# Patient Record
Sex: Female | Born: 1995 | Race: White | Hispanic: No | Marital: Single | Smoking: Never smoker
Health system: Southern US, Community
[De-identification: ages and names within clinical notes are randomized; demographics above are authoritative.]

## PROBLEM LIST (undated history)

## (undated) DIAGNOSIS — T7840XA Allergy, unspecified, initial encounter: Secondary | ICD-10-CM

## (undated) DIAGNOSIS — J189 Pneumonia, unspecified organism: Secondary | ICD-10-CM

## (undated) DIAGNOSIS — F88 Other disorders of psychological development: Secondary | ICD-10-CM

## (undated) DIAGNOSIS — Q181 Preauricular sinus and cyst: Secondary | ICD-10-CM

## (undated) DIAGNOSIS — H65112 Acute and subacute allergic otitis media (mucoid) (sanguinous) (serous), left ear: Secondary | ICD-10-CM

## (undated) DIAGNOSIS — K219 Gastro-esophageal reflux disease without esophagitis: Secondary | ICD-10-CM

## (undated) HISTORY — PX: WISDOM TOOTH EXTRACTION: SHX21

## (undated) HISTORY — DX: Other disorders of psychological development: F88

## (undated) HISTORY — DX: Allergy, unspecified, initial encounter: T78.40XA

## (undated) HISTORY — DX: Gastro-esophageal reflux disease without esophagitis: K21.9

## (undated) HISTORY — DX: Pneumonia, unspecified organism: J18.9

## (undated) HISTORY — DX: Acute and subacute allergic otitis media (mucoid) (sanguinous) (serous), left ear: H65.112

## (undated) HISTORY — DX: Preauricular sinus and cyst: Q18.1

---
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
## 2018-02-19 DIAGNOSIS — M79671 Pain in right foot: Secondary | ICD-10-CM | POA: Diagnosis not present

## 2018-06-13 DIAGNOSIS — T7840XA Allergy, unspecified, initial encounter: Secondary | ICD-10-CM | POA: Diagnosis not present

## 2018-06-23 DIAGNOSIS — Z91018 Allergy to other foods: Secondary | ICD-10-CM | POA: Diagnosis not present

## 2018-06-23 DIAGNOSIS — J3089 Other allergic rhinitis: Secondary | ICD-10-CM | POA: Diagnosis not present

## 2018-06-23 DIAGNOSIS — J3081 Allergic rhinitis due to animal (cat) (dog) hair and dander: Secondary | ICD-10-CM | POA: Diagnosis not present

## 2018-06-23 DIAGNOSIS — J301 Allergic rhinitis due to pollen: Secondary | ICD-10-CM | POA: Diagnosis not present

## 2018-11-27 DIAGNOSIS — Z23 Encounter for immunization: Secondary | ICD-10-CM | POA: Diagnosis not present

## 2018-12-11 ENCOUNTER — Other Ambulatory Visit: Payer: Self-pay

## 2018-12-11 DIAGNOSIS — Z20822 Contact with and (suspected) exposure to covid-19: Secondary | ICD-10-CM

## 2018-12-12 LAB — NOVEL CORONAVIRUS, NAA: SARS-CoV-2, NAA: NOT DETECTED

## 2019-04-09 DIAGNOSIS — M79641 Pain in right hand: Secondary | ICD-10-CM | POA: Diagnosis not present

## 2019-04-09 DIAGNOSIS — Z6820 Body mass index (BMI) 20.0-20.9, adult: Secondary | ICD-10-CM | POA: Diagnosis not present

## 2019-04-09 DIAGNOSIS — M79642 Pain in left hand: Secondary | ICD-10-CM | POA: Diagnosis not present

## 2019-06-02 DIAGNOSIS — Q676 Pectus excavatum: Secondary | ICD-10-CM | POA: Diagnosis not present

## 2019-06-02 DIAGNOSIS — Z6820 Body mass index (BMI) 20.0-20.9, adult: Secondary | ICD-10-CM | POA: Diagnosis not present

## 2019-06-02 DIAGNOSIS — R0781 Pleurodynia: Secondary | ICD-10-CM | POA: Diagnosis not present

## 2019-07-15 IMAGING — CT CT ABD-PEL WO/W CM
2 of 9 series · 13 of 36 positions shown, 18 images · IV contrast (iopamidol)
Comparison: None.

CLINICAL DATA: Elevated testosterone level. Evaluate for possible
adrenal or ovarian lesion.

EXAM:
CT ABDOMEN AND PELVIS WITHOUT AND WITH CONTRAST
TECHNIQUE: Multidetector CT imaging of the abdomen and pelvis was performed
following the standard protocol before and following the bolus
administration of intravenous contrast.
CONTRAST:  100mL V8TRXH-Q99 IOPAMIDOL (V8TRXH-Q99) INJECTION 61%

[Series 6: portal venous · axial · portal-venous · 0.67mm/px · z∈[-377,-77]mm · 7 of 160 slices shown, 12 images]
[im 20/160  soft-tissue]
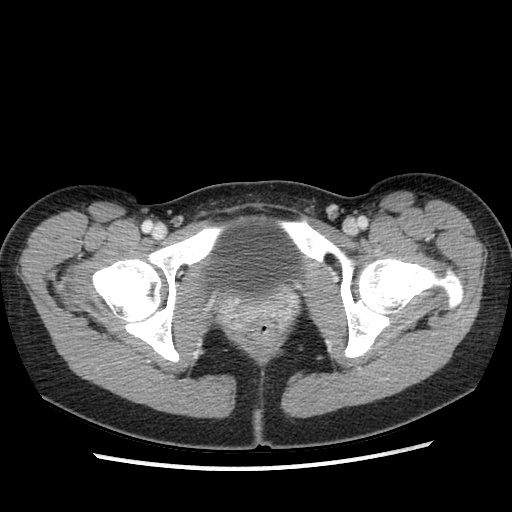
[im 20/160  bone]
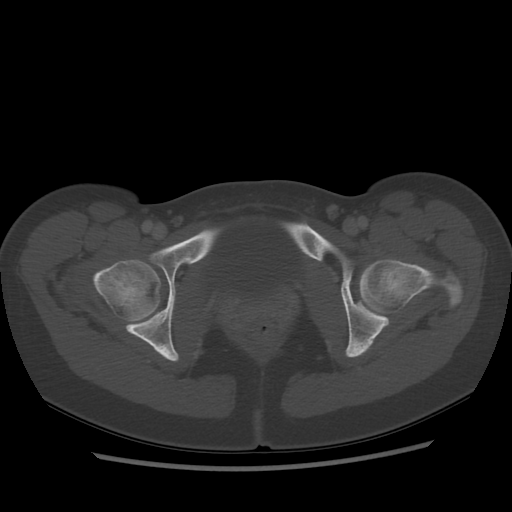
[im 40/160  soft-tissue]
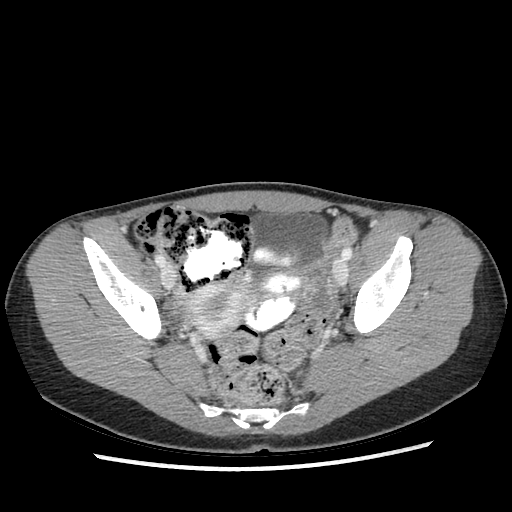
[im 60/160  soft-tissue]
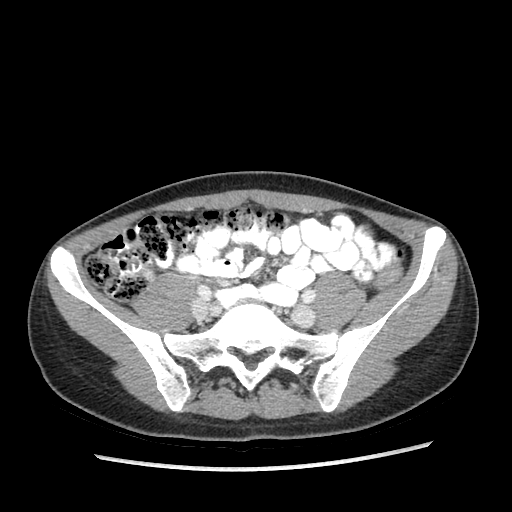
[im 80/160  soft-tissue]
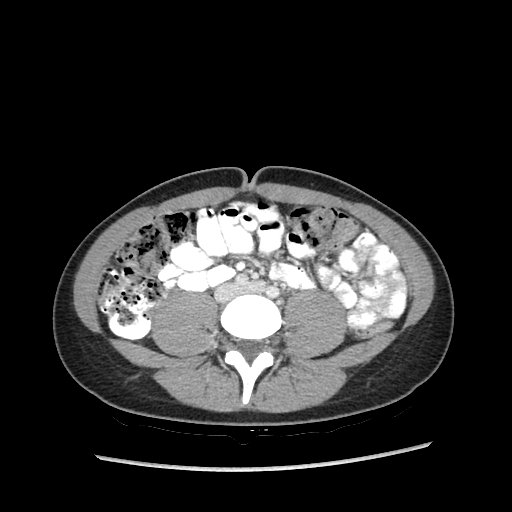
[im 80/160  lung]
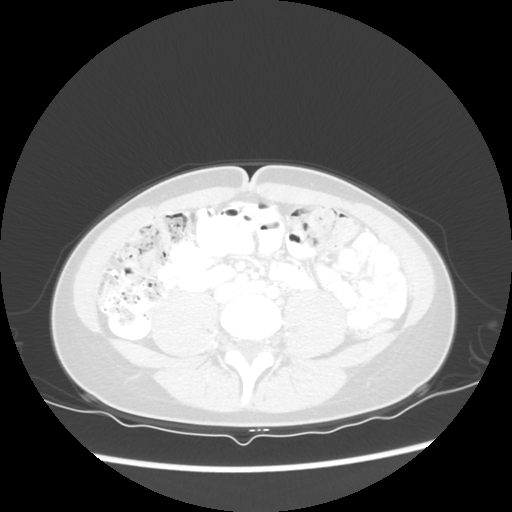
[im 100/160  soft-tissue]
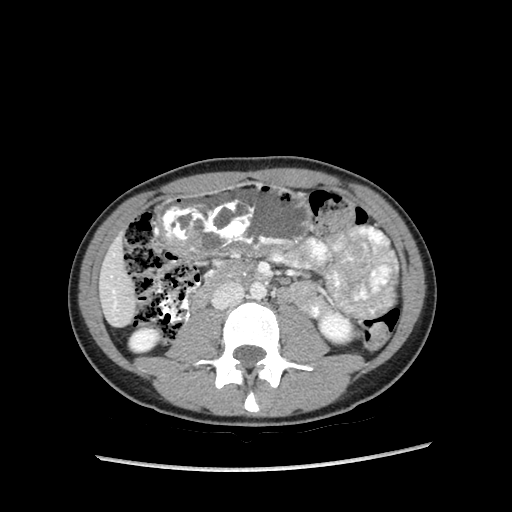
[im 100/160  lung]
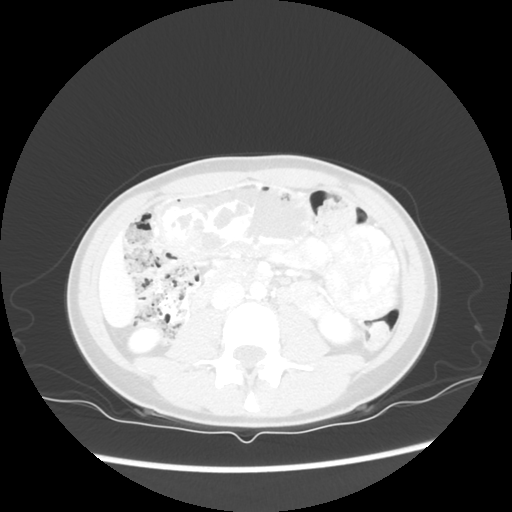
[im 120/160  soft-tissue]
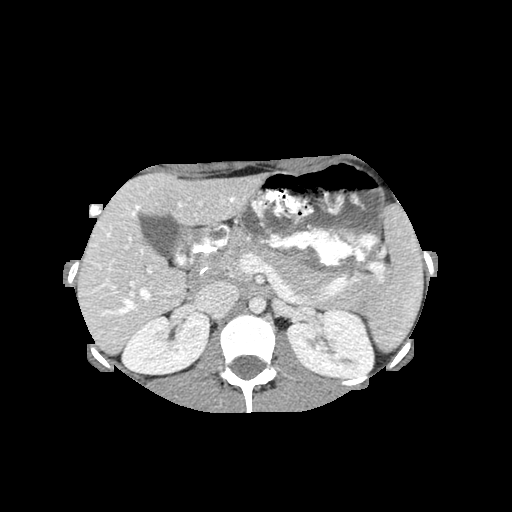
[im 120/160  lung]
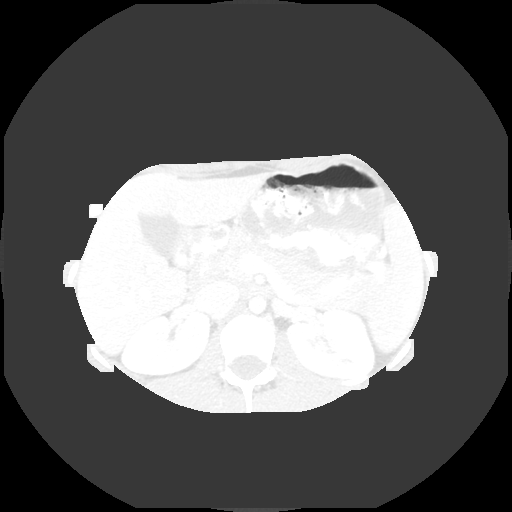
[im 140/160  soft-tissue]
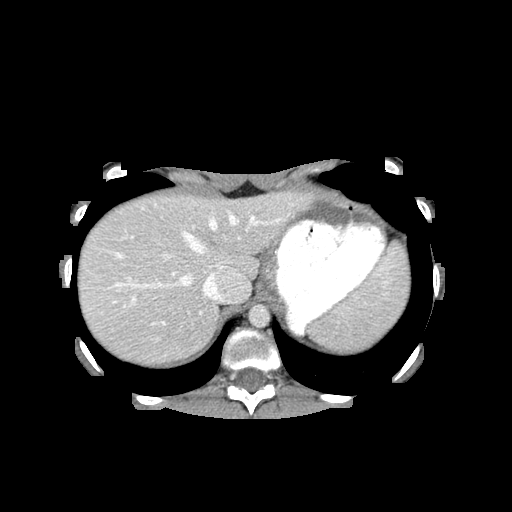
[im 140/160  lung]
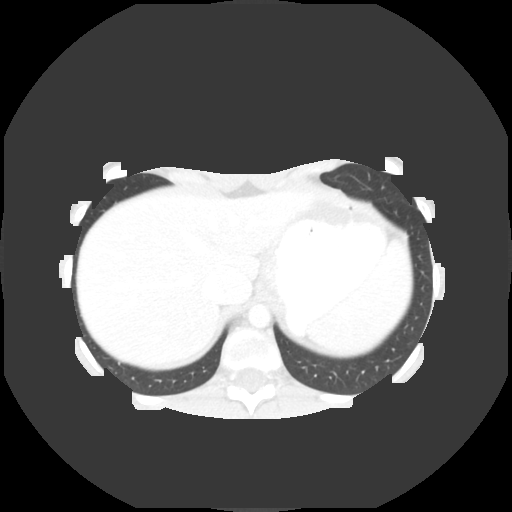

[Series 605: sagittal body · sagittal · 0.80mm/px · 6 of 138 slices shown]
[im 20/138  soft-tissue]
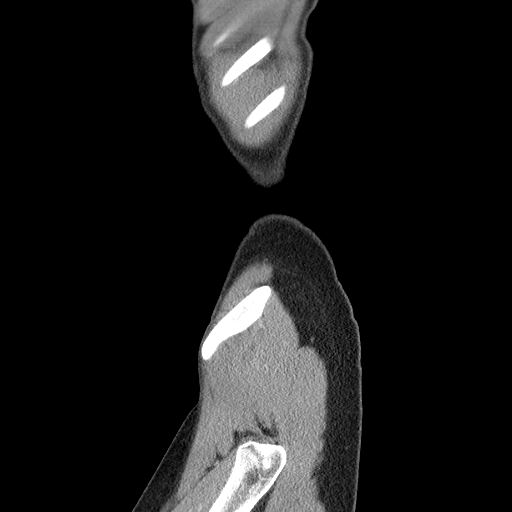
[im 40/138  soft-tissue]
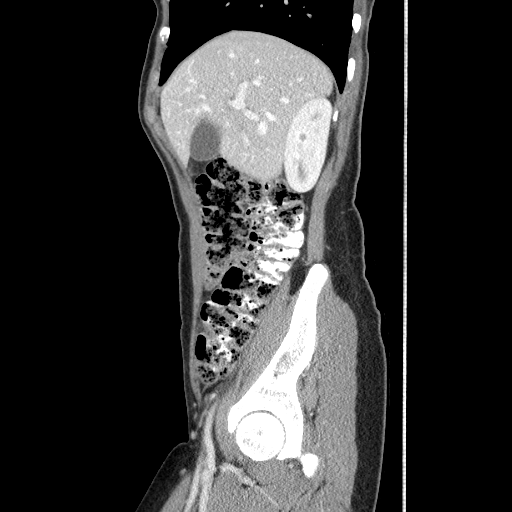
[im 59/138  soft-tissue]
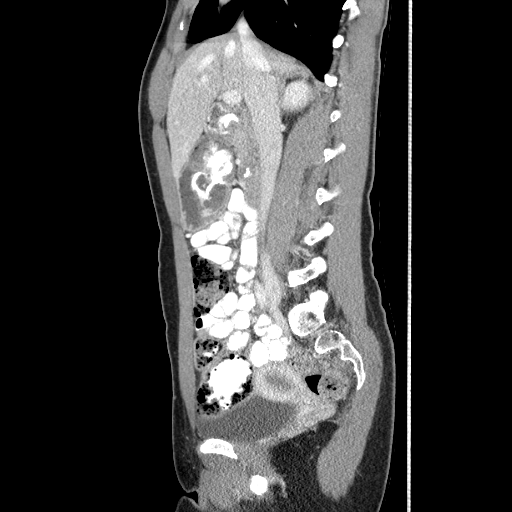
[im 79/138  soft-tissue]
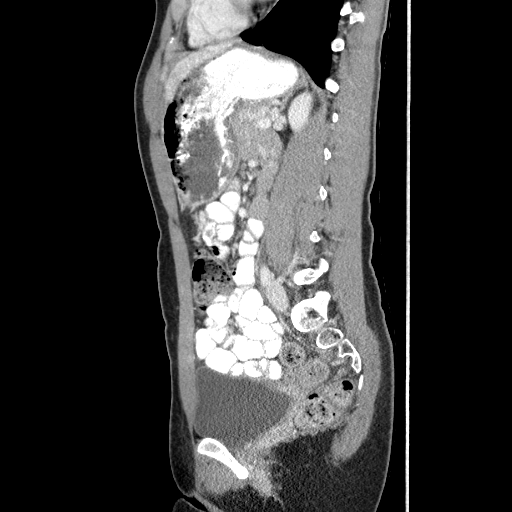
[im 98/138  soft-tissue]
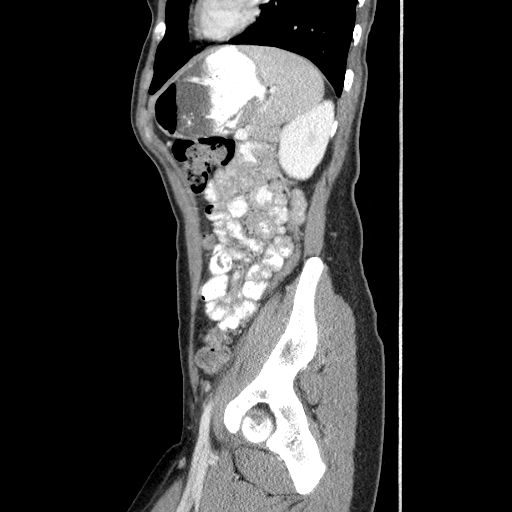
[im 118/138  soft-tissue]
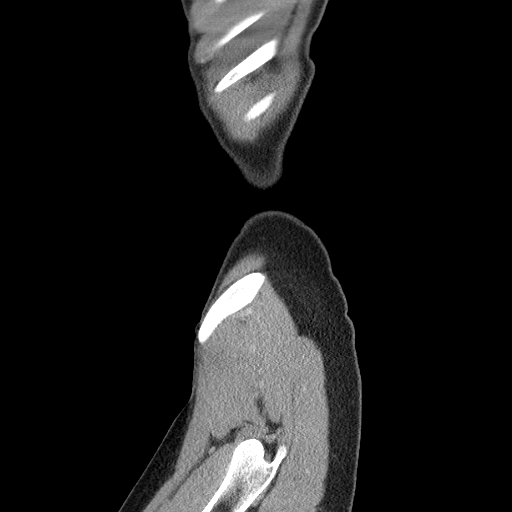

[13 of 36 positions shown; findings below may reference images not displayed]

FINDINGS: Lower chest: The lung bases are clear.  Pectus deformity noted.

Hepatobiliary: No hepatic lesions or intrahepatic biliary
dilatation. The portal and hepatic veins are normal. The gallbladder
is normal. No common bile duct dilatation.

Pancreas: No mass, inflammation or ductal dilatation.

Spleen: Normal size.  No focal lesions.

Adrenals/Urinary Tract: The adrenal glands are normal. No adrenal
gland nodules or masses identified. Normal enhancement. Both kidneys
are normal. The bladder is normal.

Stomach/Bowel: The stomach, duodenum, small bowel and colon are
grossly normal. No acute inflammatory process, mass lesions or
obstructive findings. The terminal ileum is normal. The appendix is
normal.

Vascular/Lymphatic: The aorta is normal in caliber. No dissection.
The branch vessels are patent. The major venous structures are
patent. No mesenteric or retroperitoneal mass or adenopathy. Small
scattered lymph nodes are noted.

Reproductive: The uterus and ovaries are unremarkable. There is a
simple appearing cyst associated with the left ovary. No worrisome
ovarian lesions.

Other: No pelvic mass or adenopathy. No free pelvic fluid
collections. No inguinal mass or adenopathy. No abdominal wall
hernia or subcutaneous lesions.

Musculoskeletal: No significant bony findings.
IMPRESSION: 1. No adrenal gland or ovarian lesions are identified. Simple
appearing left ovarian cyst.
2. No acute abdominal/pelvic findings, mass lesions or adenopathy.

## 2021-12-11 DIAGNOSIS — J309 Allergic rhinitis, unspecified: Secondary | ICD-10-CM | POA: Diagnosis not present

## 2021-12-11 DIAGNOSIS — J4520 Mild intermittent asthma, uncomplicated: Secondary | ICD-10-CM | POA: Diagnosis not present

## 2021-12-11 DIAGNOSIS — D6851 Activated protein C resistance: Secondary | ICD-10-CM | POA: Diagnosis not present

## 2021-12-11 DIAGNOSIS — Z23 Encounter for immunization: Secondary | ICD-10-CM | POA: Diagnosis not present

## 2021-12-11 DIAGNOSIS — Z01419 Encounter for gynecological examination (general) (routine) without abnormal findings: Secondary | ICD-10-CM | POA: Diagnosis not present

## 2022-02-02 ENCOUNTER — Ambulatory Visit (HOSPITAL_COMMUNITY): Admit: 2022-02-02 | Discharge status: Against Medical Advice | Payer: BLUE CROSS/BLUE SHIELD

## 2022-02-02 DIAGNOSIS — B349 Viral infection, unspecified: Secondary | ICD-10-CM | POA: Diagnosis not present

## 2022-02-02 DIAGNOSIS — J101 Influenza due to other identified influenza virus with other respiratory manifestations: Secondary | ICD-10-CM | POA: Diagnosis not present

## 2022-02-02 DIAGNOSIS — L03031 Cellulitis of right toe: Secondary | ICD-10-CM | POA: Diagnosis not present

## 2022-05-11 ENCOUNTER — Ambulatory Visit (HOSPITAL_COMMUNITY)
Admission: RE | Admit: 2022-05-11 | Discharge: 2022-05-11 | Disposition: A | Discharge status: Home / Self Care | Payer: BC Managed Care – PPO | Source: Ambulatory Visit

## 2022-05-11 ENCOUNTER — Encounter (HOSPITAL_COMMUNITY): Payer: Self-pay

## 2022-05-11 VITALS — BP 128/92 | HR 98 | Temp 97.8°F | Resp 16

## 2022-05-11 DIAGNOSIS — L03116 Cellulitis of left lower limb: Secondary | ICD-10-CM | POA: Diagnosis not present

## 2022-05-11 DIAGNOSIS — W57XXXA Bitten or stung by nonvenomous insect and other nonvenomous arthropods, initial encounter: Secondary | ICD-10-CM

## 2022-05-11 DIAGNOSIS — S90862A Insect bite (nonvenomous), left foot, initial encounter: Secondary | ICD-10-CM

## 2022-05-11 MED ORDER — DOXYCYCLINE HYCLATE 100 MG PO CAPS: 100.0000 mg | ORAL_CAPSULE | Freq: Two times a day (BID) | ORAL | 0 refills | Status: AC

## 2022-05-11 MED ORDER — TRIAMCINOLONE ACETONIDE 0.1 % EX CREA: 1.0000 | TOPICAL_CREAM | Freq: Two times a day (BID) | CUTANEOUS | 0 refills | Status: AC

## 2022-05-11 NOTE — Discharge Instructions (Signed)
Advised to use Epsom salt soaks, 1 tablespoon in warm water, soak for about 10 to 15 minutes, 3-4 times throughout the evening as this will help the area to resolve. Advised to use triamcinolone cream and apply to the area 2-3 times a day over the next several days as this will help reduce the inflammatory response. Advised take doxycycline 100 mg twice daily for 5 days only as this will help reduce infectious process.  Advised follow-up PCP return to urgent care as needed.

## 2022-05-11 NOTE — ED Provider Notes (Signed)
[facility]    CSN: [id number] Arrival date & time: 05/11/22  1116      History   Chief Complaint Chief Complaint  Patient presents with   Insect Bite    HPI Stephanie Kline is a 27 y.o. female.   27 year old female presents with a fire and bite on the left dorsal surface of the foot.  Patient indicates 6 [place] she was walking around the yard when she was bit by Archivistfire ants.  She indicates that she felt the bite on the left top of her foot.  She wore shoes the next day walking around and then afterwards she started having burning pain and discomfort at the site.  She indicates that a blister and redness developed.  She indicates that she has been treating the area over the past several days with a cortisone cream and has taken some Benadryl to decrease the itching and discomfort.  She indicates that the pain is resolved as well as the itching.  She is concerned about the blister that has formed it has not drained yet, she does have redness surrounding the blister area and mild redness around the third and fourth digit where the blister is located on the dorsal surface.  She is without fever or chills.     Past Medical History:  Diagnosis Date   Acute mucoid otitis media of left ear 03/08/2013   Allergy    Asthma    Cyst of ear canal    Infected in 2012 and had to be drained. Refer to ENT if recurs   GERD (gastroesophageal reflux disease) age 833 months - several yrs of age   Nl UGI at age 233 months, multiple meds over several year starting as infant: ranitidine, reglan, cisaprid, prilosec, proplusid, antacids .   Pneumonia 01/2004, 05/2004   pneumonia with asthma attack   Sensory integration disorder of childhood 2003   Received OT     Patient Active Problem List   Diagnosis Date Noted   Pulled muscle 10/21/2013   Need for prophylactic vaccination and inoculation against influenza 10/21/2013   BMI (body mass index),  pediatric, 5% to less than 85% for age 11/12/2013   Exercise induced bronchospasm 03/08/2013   Asthma, mild intermittent, well-controlled 06/27/2010   Seasonal allergies 06/27/2010    Past Surgical History:  Procedure Laterality Date   WISDOM TOOTH EXTRACTION      OB History   No obstetric history on file.      Home Medications    Prior to Admission medications   Medication Sig Start Date End Date Taking? Authorizing Provider  doxycycline (VIBRAMYCIN) 100 MG capsule Take 1 capsule (100 mg total) by mouth 2 (two) times daily. 05/11/22  Yes Ellsworth LennoxJames, Brieonna Crutcher, PA-C  Fexofenadine HCl (ALLEGRA PO) Take by mouth.   Yes [provider]  triamcinolone cream (KENALOG) 0.1 % Apply 1 Application topically 2 (two) times daily. 05/11/22  Yes Ellsworth LennoxJames, Roald Lukacs, PA-C  albuterol (VENTOLIN HFA) 108 (90 BASE) MCG/ACT inhaler Inhale 2 puffs into the lungs every 4 (four) hours as needed for wheezing or shortness of breath. 07/07/14   Preston FleetingHooker, Sandar Krinke B, MD  amoxicillin-clavulanate (AUGMENTIN) 875-125 MG tablet Take 1 tablet by mouth 2 (two) times daily. 01/30/15   Carmelina DaneAnderson, Jeffery S, MD  cetirizine (ZYRTEC) 10 MG chewable tablet Chew 10 mg by mouth daily.    [provider]  chlorpheniramine-HYDROcodone Stevphen Meuse(TUSSIONEX PENNKINETIC ER) 10-8 MG/5ML SUER Take  5 mLs by mouth 2 (two) times daily. 01/30/15   Carmelina DaneAnderson, Jeffery S, MD  fluticasone [facility](FLONASE) 50 MCG/ACT nasal spray Place 1 spray into both nostrils daily. 05/25/15 05/24/16  Estelle JuneKlett, Lynn M, NP  fluticasone (FLOVENT HFA) 44 MCG/ACT inhaler 2 puffs twice daily x1 week, then 1 puff twice daily x1 week. 03/08/13   Meryl DareWhitaker, Erin W, NP    Family History Family History  Problem Relation Age of Onset   Asthma Mother    Cancer Mother        breast   Stroke Mother    Asthma Father    Hyperlipidemia Maternal Grandmother    Hypertension Maternal Grandmother    Stroke Maternal Grandmother        brain stem after fall   Varicose Veins Maternal Grandmother     Alcohol abuse Maternal Grandfather    Cancer Maternal Grandfather        throat   COPD Maternal Grandfather    Diabetes Maternal Grandfather    Hearing loss Maternal Grandfather    Hyperlipidemia Maternal Grandfather    Vision loss Paternal Grandmother    Cancer Paternal Grandfather        colon   Hearing loss Paternal Grandfather    Heart disease Paternal Grandfather    Arthritis Neg Hx    Birth defects Neg Hx    Depression Neg Hx    Drug abuse Neg Hx    Early death Neg Hx    Kidney disease Neg Hx    Learning disabilities Neg Hx    Mental illness Neg Hx    Mental retardation Neg Hx    Miscarriages / Stillbirths Neg Hx     Social History Social History   Tobacco Use   Smoking status: Never   Smokeless tobacco: Never  Vaping Use   Vaping Use: Never used  Substance Use Topics   Alcohol use: Yes    Comment: socially   Drug use: No     Allergies   Ibuprofen, Red dye, and Sulfa antibiotics   Review of Systems Review of Systems  Skin:  Positive for rash (left foot mid distal dorsum insect bite.).     Physical Exam Triage Vital Signs ED Triage Vitals  Enc Vitals Group     BP 05/11/22 1125 (!) 128/92     Pulse Rate 05/11/22 1125 98     Resp 05/11/22 1125 16     Temp 05/11/22 1125 97.8 F (36.6 C)     Temp Source 05/11/22 1125 Oral     SpO2 05/11/22 1125 99 %     Weight --      Height --      Head Circumference --      Peak Flow --      Pain Score 05/11/22 1127 0     Pain Loc --      Pain Edu? --      Excl. in GC? --    No data found.  Updated Vital Signs BP (!) 128/92   Pulse 98   Temp 97.8 F (36.6 C) (Oral)   Resp 16   LMP 04/20/2022 (Exact Date)   SpO2 99%   Visual Acuity Right Eye Distance:   Left Eye Distance:   Bilateral Distance:    Right Eye Near:   Left Eye Near:    Bilateral Near:   Left Foot:     Physical Exam Constitutional:      Appearance: Normal appearance.  Musculoskeletal:  Feet:  Feet:     Comments: Left  foot: There is a point 5 cm insect bite that has a blister and central crusting, no drainage from the site.  There is surrounding redness that is a 1 cm area that involves the second and third digits of the foot, mild swelling present, no streaking. ( refer to picture) Neurological:     Mental Status: She is alert.      UC Treatments / Results  Labs (all labs ordered are listed, but only abnormal results are displayed) Labs Reviewed - No data to display  EKG   Radiology No results found.  Procedures Procedures (including critical care time)  Medications Ordered in UC Medications - No data to display  Initial Impression / Assessment and Plan / UC Course  I have reviewed the triage vital signs and the nursing notes.  Pertinent labs & imaging results that were available during my care of the patient were reviewed by me and considered in my medical decision making (see chart for details).    Plan: The diagnosis will be treated with the following: 1.  Insect bite left foot: A.  Advised Epsom salt soaks, 10 minutes 3 times a day for the next several days. B.  Triamcinolone cream 0.1%, apply to the area lightly 3 times a day to help reduce the inflammatory response. 2.  Cellulitis lower left extremity: A.  Doxycycline 100 mg every 12 hours 5 days only to help treat inflammatory or infectious response. 3.  Advised follow-up PCP return to urgent care if symptoms fail to improve. Final Clinical Impressions(s) / UC Diagnoses   Final diagnoses:  Insect bite of left foot, initial encounter  Cellulitis of left lower extremity     Discharge Instructions      Advised to use Epsom salt soaks, 1 tablespoon in warm water, soak for about 10 to 15 minutes, 3-4 times throughout the evening as this will help the area to resolve. Advised to use triamcinolone cream and apply to the area 2-3 times a day over the next several days as this will help reduce the inflammatory response. Advised  take doxycycline 100 mg twice daily for 5 days only as this will help reduce infectious process.  Advised follow-up PCP return to urgent care as needed.     ED Prescriptions     Medication Sig Dispense Auth. Provider   doxycycline (VIBRAMYCIN) 100 MG capsule Take 1 capsule (100 mg total) by mouth 2 (two) times daily. 10 capsule Ellsworth Lennox, PA-C   triamcinolone cream (KENALOG) 0.1 % Apply 1 Application topically 2 (two) times daily. 30 g Ellsworth Lennox, PA-C      PDMP not reviewed this encounter.   Ellsworth Lennox, PA-C 05/11/22 1145

## 2022-05-11 NOTE — ED Triage Notes (Signed)
Reports getting bit once on dorsal left foot by a fire ant while taking pictures outside 6 days ago. Pt states she saw the fire ant. Reports going on a brisk walk 4 days ago, and since then blister has formed at site, along with redness, swelling. Denies fevers but reports once night of night sweats approx 4 days ago. Pain and pruritus have resolved over past few days. Took Benadryl 4 days ago, taking Allegra daily, and applied calamine lotion.

## 2022-11-27 DIAGNOSIS — D225 Melanocytic nevi of trunk: Secondary | ICD-10-CM | POA: Diagnosis not present

## 2022-11-27 DIAGNOSIS — D2261 Melanocytic nevi of right upper limb, including shoulder: Secondary | ICD-10-CM | POA: Diagnosis not present

## 2022-11-27 DIAGNOSIS — L821 Other seborrheic keratosis: Secondary | ICD-10-CM | POA: Diagnosis not present

## 2022-11-27 DIAGNOSIS — D485 Neoplasm of uncertain behavior of skin: Secondary | ICD-10-CM | POA: Diagnosis not present

## 2022-11-27 DIAGNOSIS — D2262 Melanocytic nevi of left upper limb, including shoulder: Secondary | ICD-10-CM | POA: Diagnosis not present

## 2023-03-04 DIAGNOSIS — R197 Diarrhea, unspecified: Secondary | ICD-10-CM | POA: Diagnosis not present

## 2023-03-04 DIAGNOSIS — R55 Syncope and collapse: Secondary | ICD-10-CM | POA: Diagnosis not present

## 2023-03-04 DIAGNOSIS — R110 Nausea: Secondary | ICD-10-CM | POA: Diagnosis not present

## 2023-03-04 DIAGNOSIS — R109 Unspecified abdominal pain: Secondary | ICD-10-CM | POA: Diagnosis not present

## 2023-03-06 DIAGNOSIS — R109 Unspecified abdominal pain: Secondary | ICD-10-CM | POA: Diagnosis not present

## 2023-03-06 DIAGNOSIS — R197 Diarrhea, unspecified: Secondary | ICD-10-CM | POA: Diagnosis not present

## 2023-03-07 DIAGNOSIS — R109 Unspecified abdominal pain: Secondary | ICD-10-CM | POA: Diagnosis not present

## 2023-03-07 DIAGNOSIS — R197 Diarrhea, unspecified: Secondary | ICD-10-CM | POA: Diagnosis not present

## 2023-03-07 DIAGNOSIS — R110 Nausea: Secondary | ICD-10-CM | POA: Diagnosis not present

## 2023-06-12 DIAGNOSIS — E559 Vitamin D deficiency, unspecified: Secondary | ICD-10-CM | POA: Diagnosis not present

## 2023-06-12 DIAGNOSIS — F3281 Premenstrual dysphoric disorder: Secondary | ICD-10-CM | POA: Diagnosis not present

## 2023-06-12 DIAGNOSIS — N911 Secondary amenorrhea: Secondary | ICD-10-CM | POA: Diagnosis not present

## 2023-06-12 DIAGNOSIS — Z0000 Encounter for general adult medical examination without abnormal findings: Secondary | ICD-10-CM | POA: Diagnosis not present

## 2023-06-12 DIAGNOSIS — Z8349 Family history of other endocrine, nutritional and metabolic diseases: Secondary | ICD-10-CM | POA: Diagnosis not present

## 2023-06-26 DIAGNOSIS — F320 Major depressive disorder, single episode, mild: Secondary | ICD-10-CM | POA: Diagnosis not present

## 2023-07-04 DIAGNOSIS — F320 Major depressive disorder, single episode, mild: Secondary | ICD-10-CM | POA: Diagnosis not present

## 2023-07-18 DIAGNOSIS — F320 Major depressive disorder, single episode, mild: Secondary | ICD-10-CM | POA: Diagnosis not present

## 2023-07-24 DIAGNOSIS — F320 Major depressive disorder, single episode, mild: Secondary | ICD-10-CM | POA: Diagnosis not present

## 2023-07-29 DIAGNOSIS — F320 Major depressive disorder, single episode, mild: Secondary | ICD-10-CM | POA: Diagnosis not present

## 2023-08-15 DIAGNOSIS — F320 Major depressive disorder, single episode, mild: Secondary | ICD-10-CM | POA: Diagnosis not present

## 2023-08-22 DIAGNOSIS — Z01419 Encounter for gynecological examination (general) (routine) without abnormal findings: Secondary | ICD-10-CM | POA: Diagnosis not present

## 2023-08-22 DIAGNOSIS — Z6820 Body mass index (BMI) 20.0-20.9, adult: Secondary | ICD-10-CM | POA: Diagnosis not present

## 2023-08-22 DIAGNOSIS — Z124 Encounter for screening for malignant neoplasm of cervix: Secondary | ICD-10-CM | POA: Diagnosis not present

## 2023-09-12 DIAGNOSIS — F320 Major depressive disorder, single episode, mild: Secondary | ICD-10-CM | POA: Diagnosis not present

## 2023-10-03 DIAGNOSIS — F320 Major depressive disorder, single episode, mild: Secondary | ICD-10-CM | POA: Diagnosis not present

## 2023-10-31 DIAGNOSIS — F320 Major depressive disorder, single episode, mild: Secondary | ICD-10-CM | POA: Diagnosis not present

## 2023-11-14 DIAGNOSIS — F320 Major depressive disorder, single episode, mild: Secondary | ICD-10-CM | POA: Diagnosis not present

## 2023-12-11 DIAGNOSIS — F320 Major depressive disorder, single episode, mild: Secondary | ICD-10-CM | POA: Diagnosis not present

## 2023-12-23 DIAGNOSIS — F411 Generalized anxiety disorder: Secondary | ICD-10-CM | POA: Diagnosis not present

## 2023-12-24 DIAGNOSIS — F320 Major depressive disorder, single episode, mild: Secondary | ICD-10-CM | POA: Diagnosis not present

## 2024-01-06 DIAGNOSIS — F320 Major depressive disorder, single episode, mild: Secondary | ICD-10-CM | POA: Diagnosis not present

## 2024-01-21 DIAGNOSIS — F320 Major depressive disorder, single episode, mild: Secondary | ICD-10-CM | POA: Diagnosis not present
